# Patient Record
Sex: Male | Born: 1960 | Race: White | Hispanic: No | Marital: Married | Smoking: Current every day smoker
Health system: Southern US, Community
[De-identification: ages and names within clinical notes are randomized; demographics above are authoritative.]

---

## 2005-07-26 ENCOUNTER — Ambulatory Visit: Payer: Self-pay

## 2008-02-06 ENCOUNTER — Emergency Department: Payer: Self-pay | Admitting: Emergency Medicine

## 2010-10-23 ENCOUNTER — Emergency Department: Payer: Self-pay | Admitting: Emergency Medicine

## 2011-05-26 ENCOUNTER — Emergency Department: Payer: Self-pay | Admitting: Emergency Medicine

## 2012-04-05 LAB — URINALYSIS, COMPLETE
Bilirubin,UR: NEGATIVE
Ketone: NEGATIVE
Leukocyte Esterase: NEGATIVE
Nitrite: NEGATIVE
Ph: 5 (ref 4.5–8.0)
Protein: 100
Specific Gravity: 1.029 (ref 1.003–1.030)
Squamous Epithelial: <1
WBC UR: 1 /HPF (ref 0–5)

## 2012-04-05 LAB — CBC WITH DIFFERENTIAL/PLATELET
Basophil #: 0.2 x10 3/mm 3 — ABNORMAL HIGH (ref 0.0–0.1)
Basophil %: 1 %
Comment - H1-Com1: NORMAL
Comment - H1-Com2: NORMAL
Eosinophil #: 0.1 x10 3/mm 3 (ref 0.0–0.7)
Eosinophil %: 0.3 %
HCT: 46.9 % (ref 40.0–52.0)
HGB: 16.2 g/dL (ref 13.0–18.0)
Lymphocyte %: 15.4 %
Lymphs Abs: 3 x10 3/mm 3 (ref 1.0–3.6)
MCH: 29.5 pg (ref 26.0–34.0)
MCHC: 34.5 g/dL (ref 32.0–36.0)
MCV: 86 fL (ref 80–100)
Monocyte #: 2.4 x10 3/mm — ABNORMAL HIGH (ref 0.2–1.0)
Monocyte %: 11.9 %
Neutrophil #: 14.1 x10 3/mm 3 — ABNORMAL HIGH (ref 1.4–6.5)
Neutrophil %: 71.4 %
Platelet: 376 x10 3/mm 3 (ref 150–440)
RBC: 5.49 x10 6/mm 3 (ref 4.40–5.90)
RDW: 13.9 % (ref 11.5–14.5)
WBC: 19.7 x10 3/mm 3 — ABNORMAL HIGH (ref 3.8–10.6)

## 2012-04-05 LAB — COMPREHENSIVE METABOLIC PANEL WITH GFR
Albumin: 3.4 g/dL (ref 3.4–5.0)
Alkaline Phosphatase: 182 U/L — ABNORMAL HIGH (ref 50–136)
Anion Gap: 9 (ref 7–16)
BUN: 25 mg/dL — ABNORMAL HIGH (ref 7–18)
Bilirubin,Total: 1 mg/dL (ref 0.2–1.0)
Calcium, Total: 9.1 mg/dL (ref 8.5–10.1)
Chloride: 103 mmol/L (ref 98–107)
Co2: 26 mmol/L (ref 21–32)
Creatinine: 1 mg/dL (ref 0.60–1.30)
EGFR (African American): >60
EGFR (Non-African Amer.): >60
Glucose: 107 mg/dL — ABNORMAL HIGH (ref 65–99)
Osmolality: 281 (ref 275–301)
Potassium: 3.9 mmol/L (ref 3.5–5.1)
SGOT(AST): 38 U/L — ABNORMAL HIGH (ref 15–37)
SGPT (ALT): 76 U/L (ref 12–78)
Sodium: 138 mmol/L (ref 136–145)
Total Protein: 8.9 g/dL — ABNORMAL HIGH (ref 6.4–8.2)

## 2012-04-06 ENCOUNTER — Inpatient Hospital Stay: Payer: Self-pay | Admitting: Otolaryngology

## 2012-04-07 LAB — BETA STREP CULTURE(ARMC)

## 2012-04-11 LAB — WOUND CULTURE

## 2013-01-03 ENCOUNTER — Emergency Department: Payer: Self-pay | Admitting: Emergency Medicine

## 2013-01-03 LAB — COMPREHENSIVE METABOLIC PANEL
Albumin: 4.1 g/dL (ref 3.4–5.0)
BUN: 11 mg/dL (ref 7–18)
Bilirubin,Total: 0.5 mg/dL (ref 0.2–1.0)
Co2: 30 mmol/L (ref 21–32)
Creatinine: 1.05 mg/dL (ref 0.60–1.30)
Glucose: 113 mg/dL — ABNORMAL HIGH (ref 65–99)
Potassium: 3.9 mmol/L (ref 3.5–5.1)
Sodium: 137 mmol/L (ref 136–145)
Total Protein: 7.9 g/dL (ref 6.4–8.2)

## 2013-01-03 LAB — LIPASE, BLOOD: Lipase: 89 U/L (ref 73–393)

## 2013-01-03 LAB — CBC
MCHC: 34.9 g/dL (ref 32.0–36.0)
MCV: 86 fL (ref 80–100)
Platelet: 337 10*3/uL (ref 150–440)
RBC: 5.12 10*6/uL (ref 4.40–5.90)
RDW: 13.7 % (ref 11.5–14.5)

## 2013-01-08 IMAGING — CT CT CERVICAL SPINE WITHOUT CONTRAST
1 series · 12 of 14 positions shown, 15 images · non-contrast
Comparison: none

REASON FOR EXAM: posterior neck pain, fell 2 weeks ago
COMMENTS:

[Series 4: axial · axial · 0.33mm/px · z∈[-245,-95]mm · 12 of 94 slices shown, 15 images]
[im 8/94  soft-tissue]
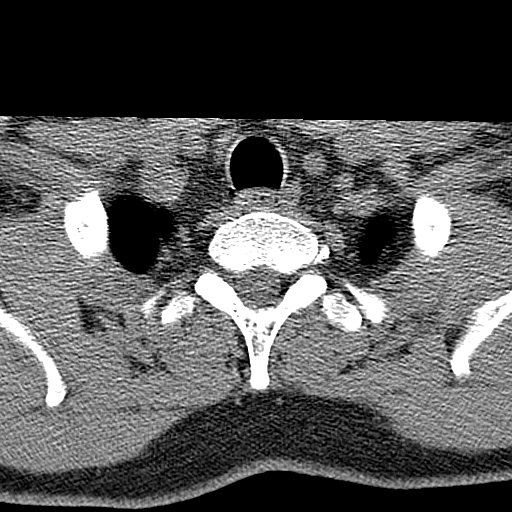
[im 8/94  bone]
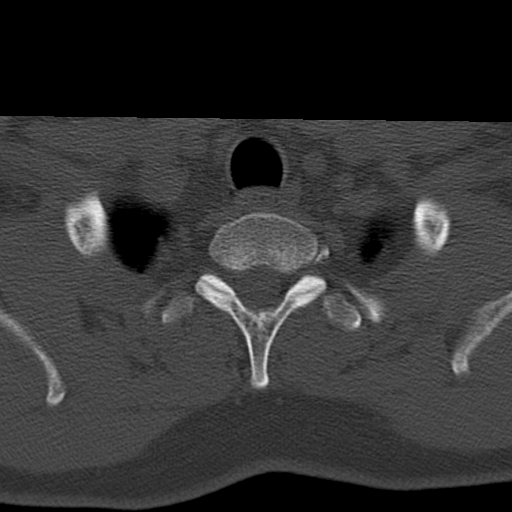
[im 15/94  bone]
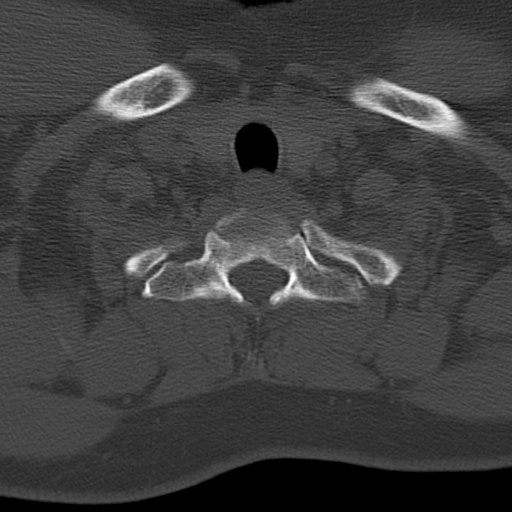
[im 22/94  bone]
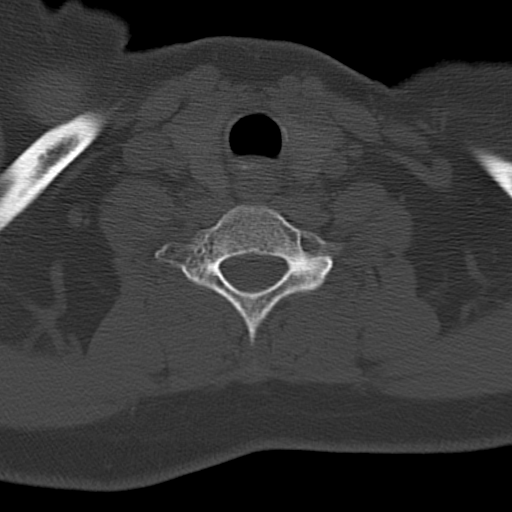
[im 29/94  bone]
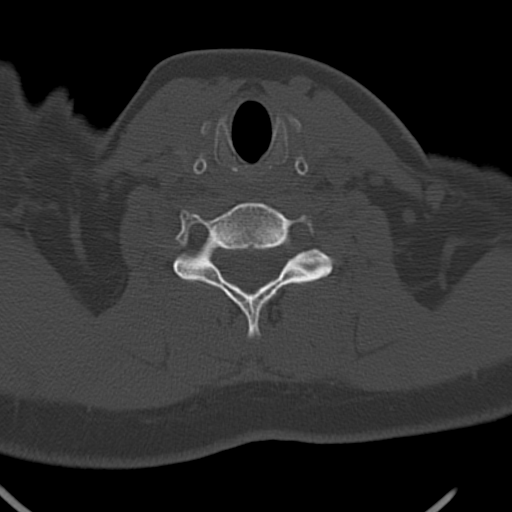
[im 36/94  soft-tissue]
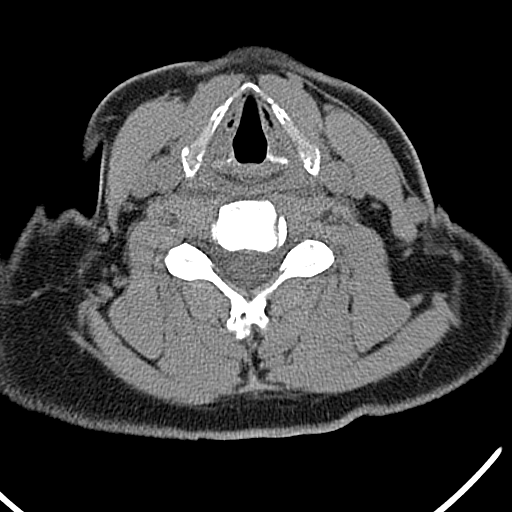
[im 36/94  bone]
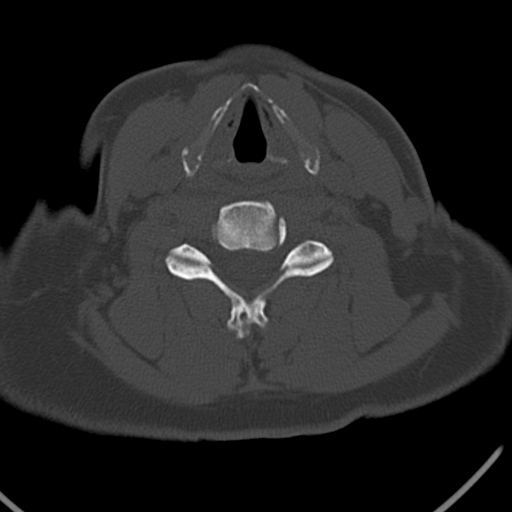
[im 43/94  bone]
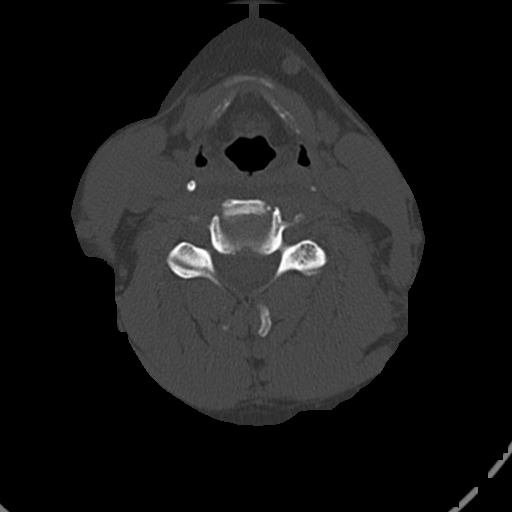
[im 51/94  bone]
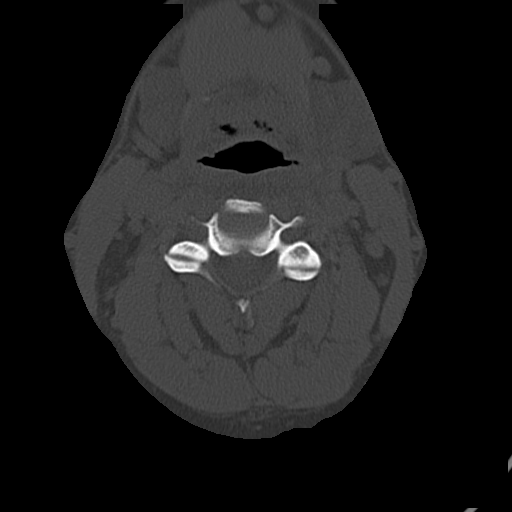
[im 58/94  bone]
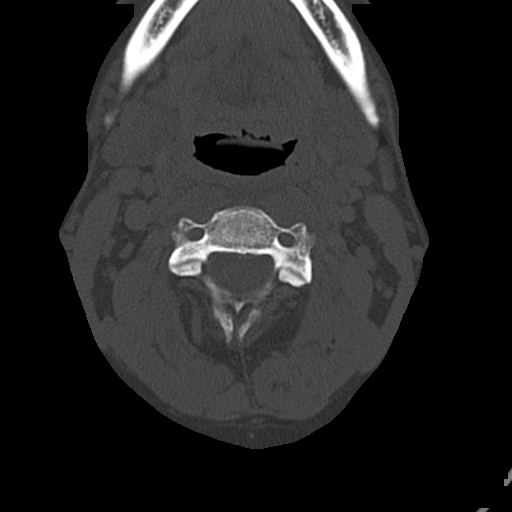
[im 65/94  soft-tissue]
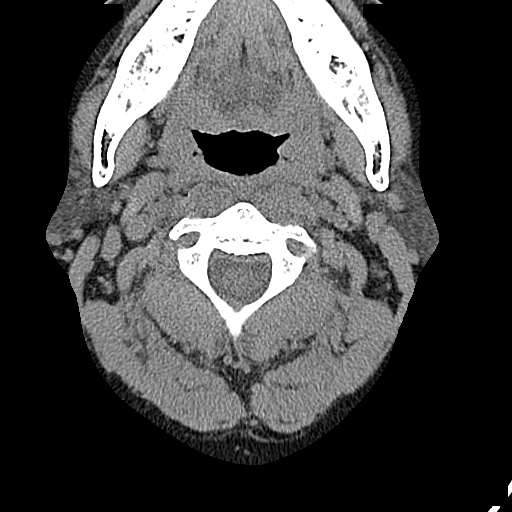
[im 65/94  bone]
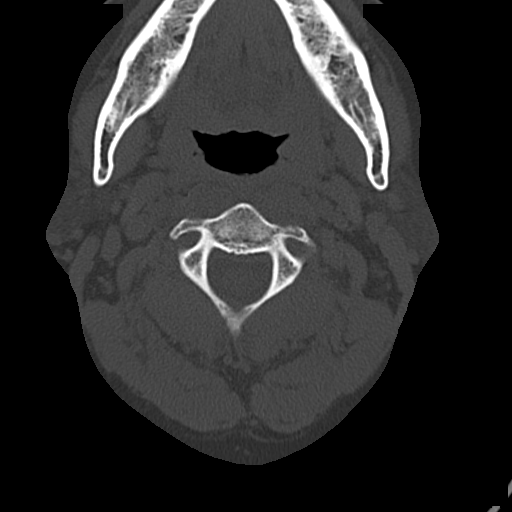
[im 72/94  bone]
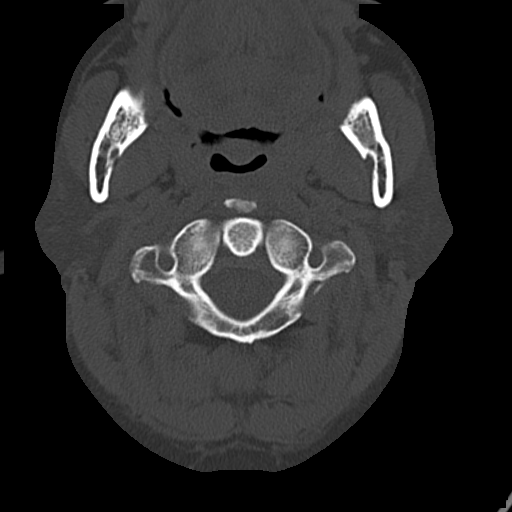
[im 79/94  bone]
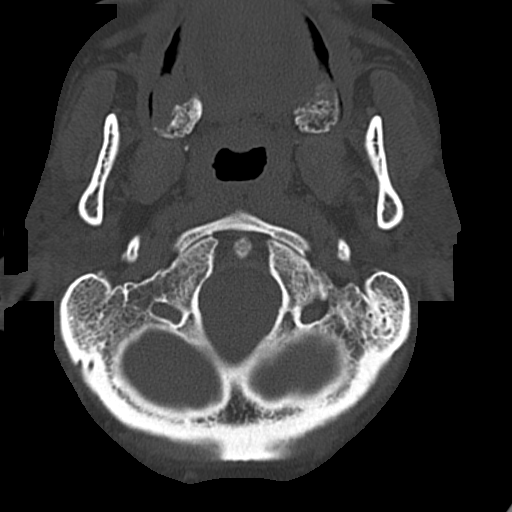
[im 86/94  bone]
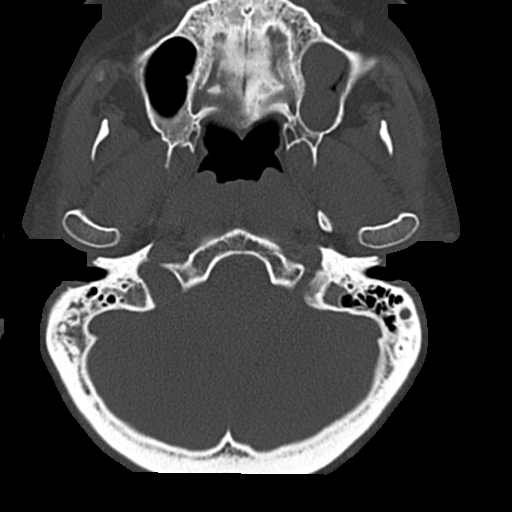

[12 of 14 positions shown; findings below may reference images not displayed]

PROCEDURE:     CT  - CT CERVICAL SPINE WO  - October 23, 2010  [DATE]

RESULT:     Multislice helical acquisition is performed through the cervical
spine with bone window reconstructions performed in the axial, coronal and
sagittal planes. The patient has no previous exam for comparison.

Spinal alignment appears to be maintained. The prevertebral soft tissues
appear normal. The spinous processes appear intact. There is no evidence of
bony encroachment into the spinal canal. The craniocervical junction and
atlantoaxial alignment appear to be normal.
IMPRESSION: No CT evidence of an acute cervical spine bony abnormality.

## 2014-06-22 IMAGING — CR DG CHEST 2V
1 series · 2 of 2 positions shown · non-contrast
Comparison: none

REASON FOR EXAM: cough with dark expectorate
COMMENTS:   May transport without cardiac monitor

[Series 1: w chest pa · 0.14mm/px · 2 of 2 slices shown]
[im 1/2]
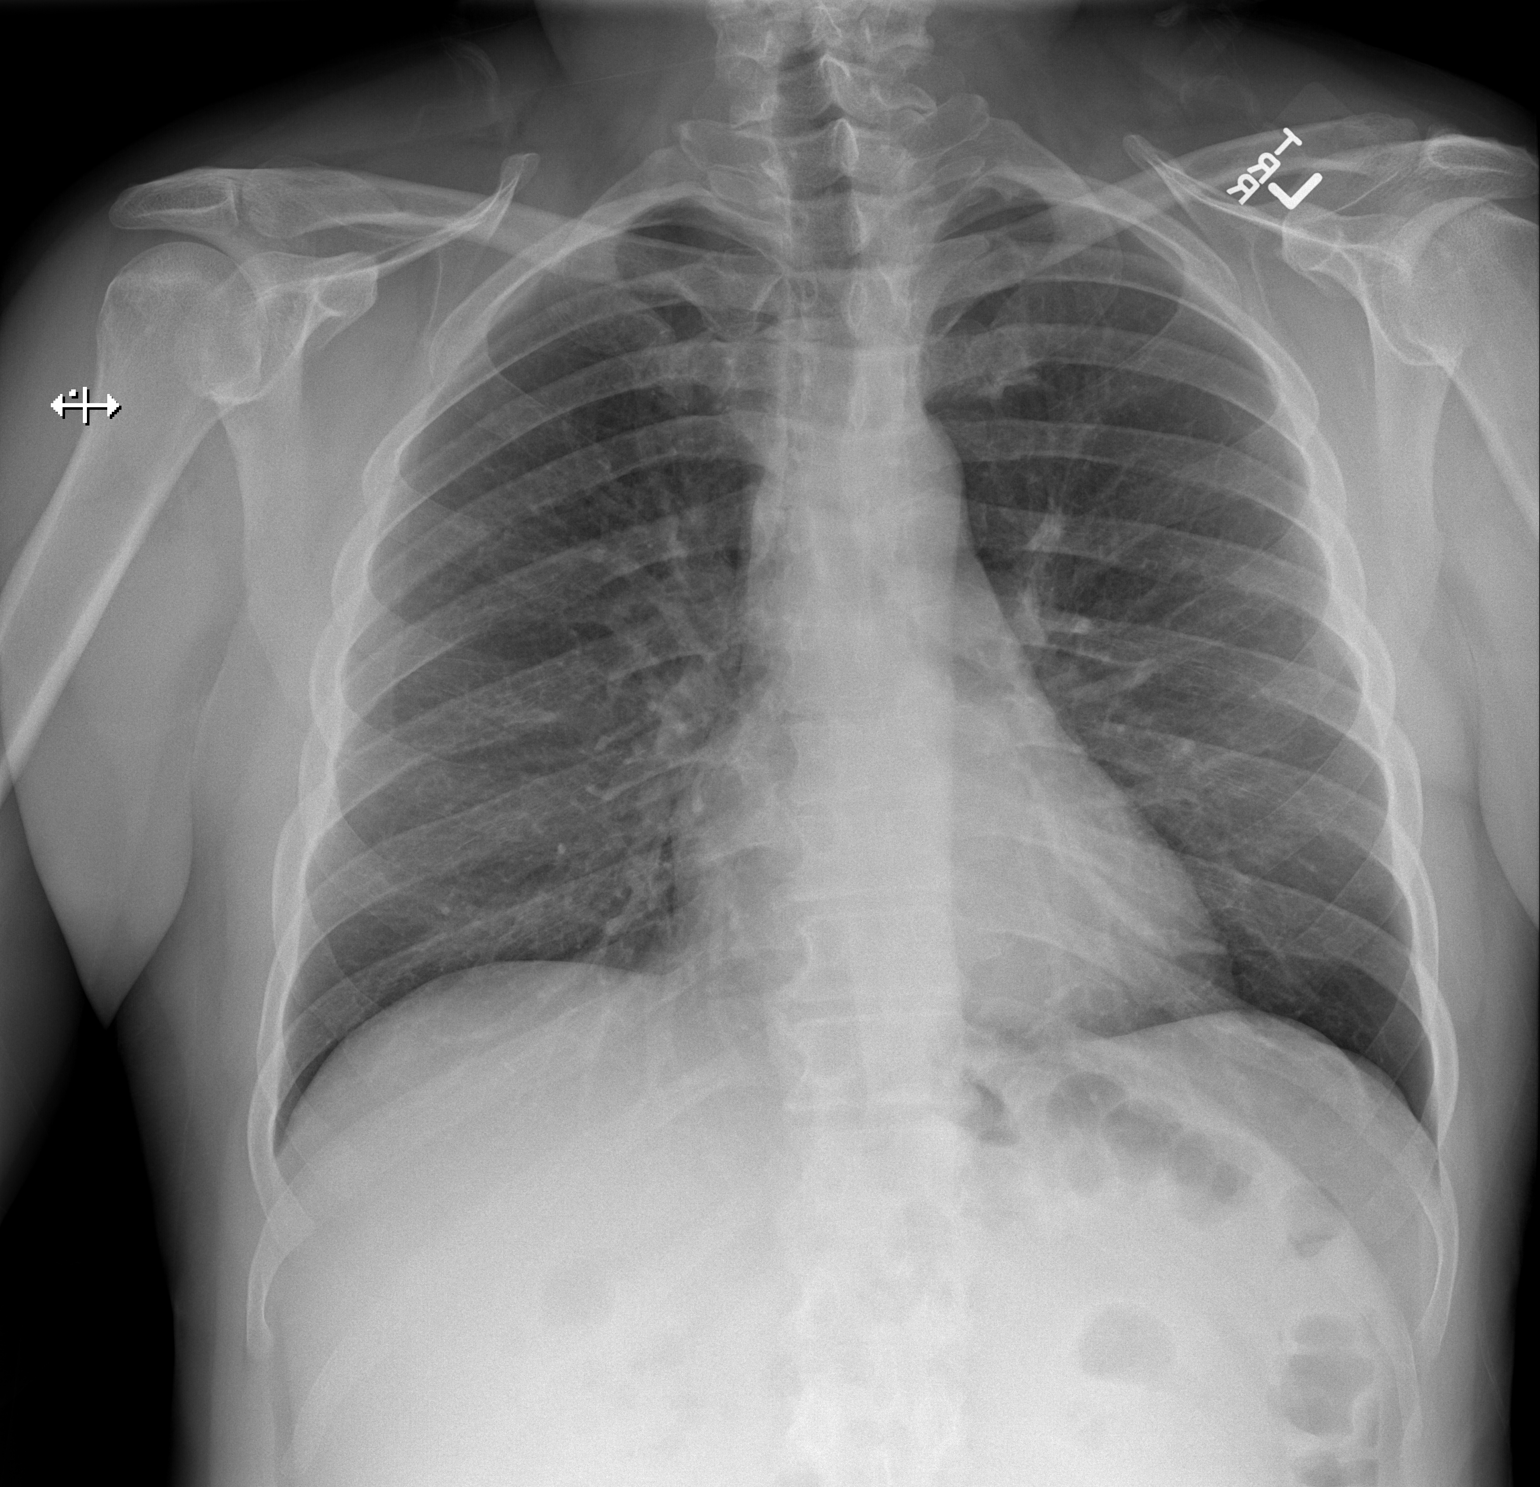
[im 2/2]
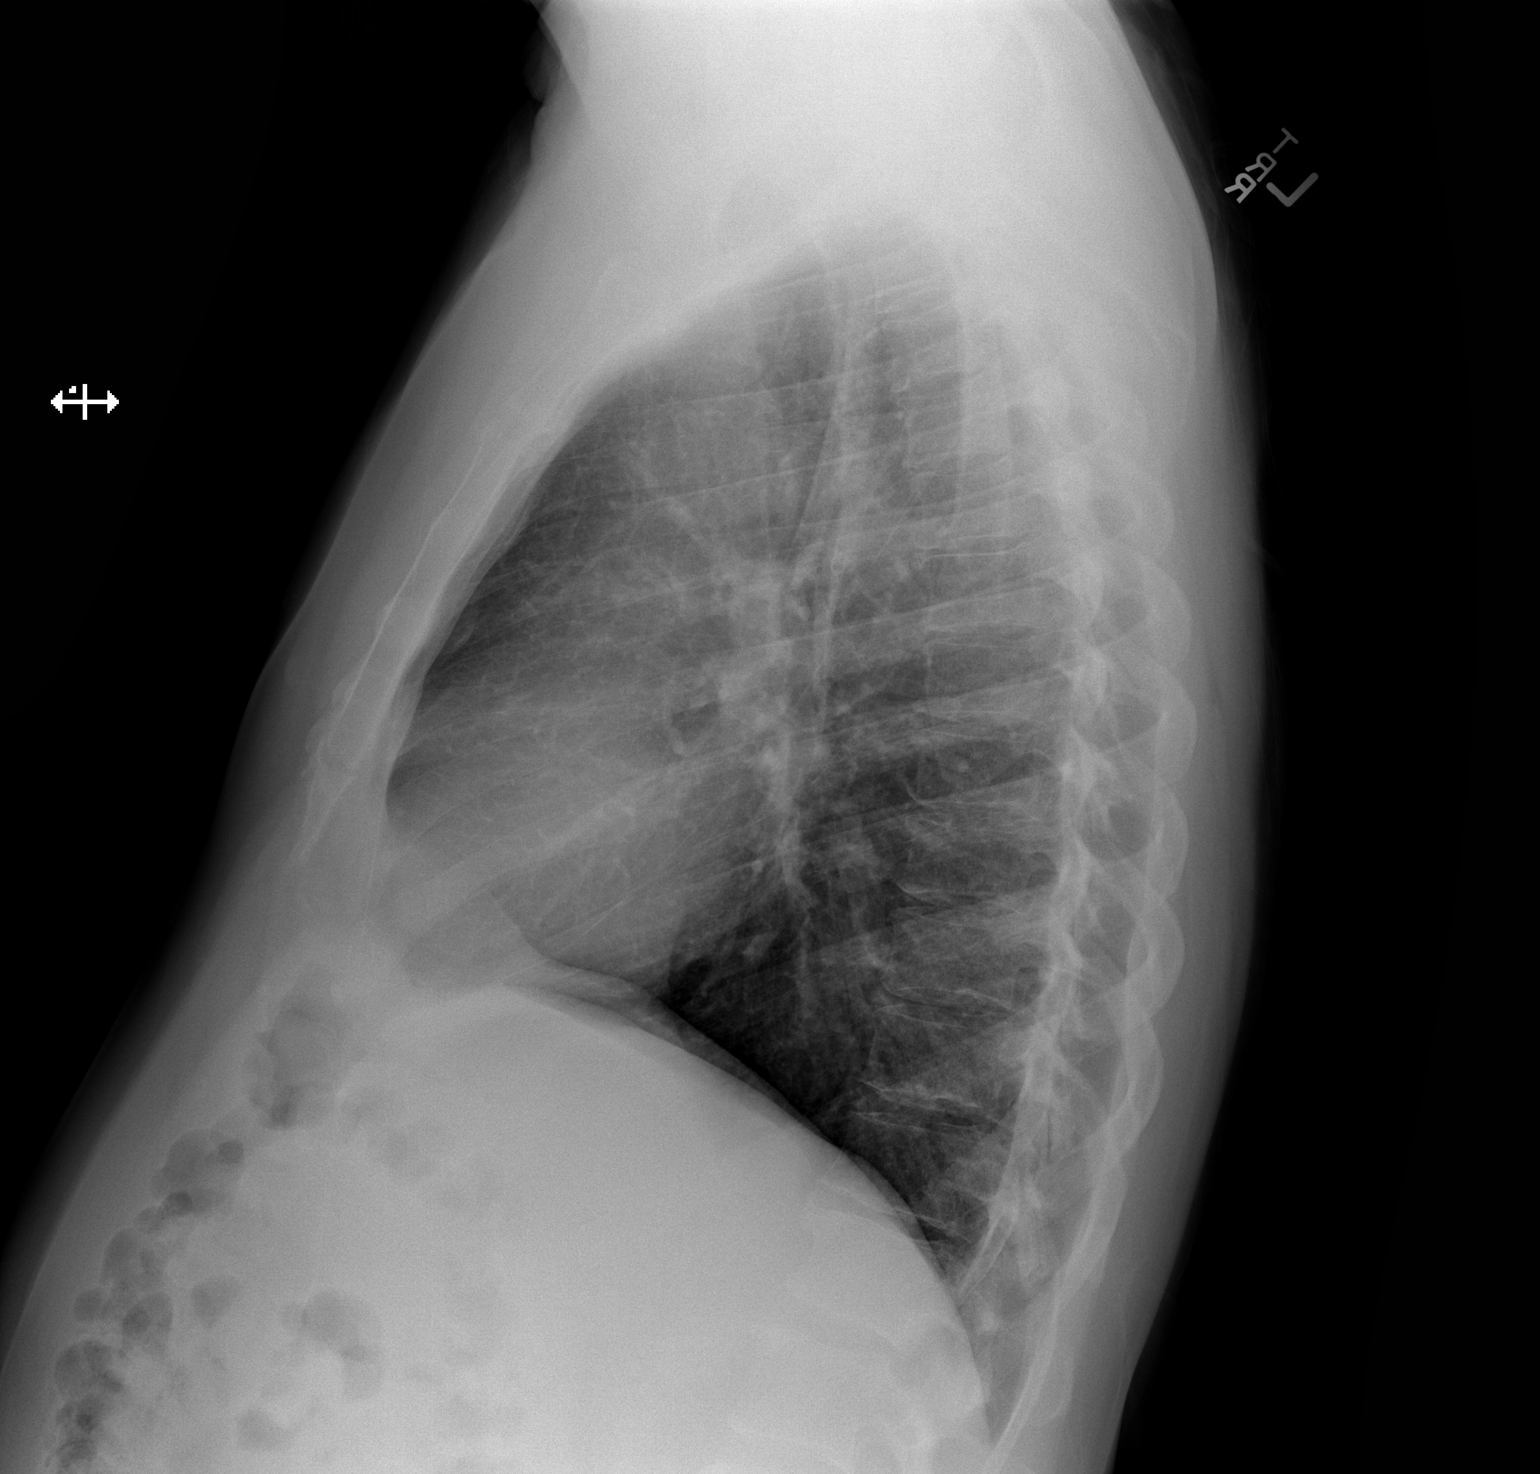

[2 of 2 positions shown; findings below may reference images not displayed]

PROCEDURE:     DXR - DXR CHEST PA (OR AP) AND LATERAL  - April 05, 2012  [DATE]

RESULT:     Comparison is made to the study 26 May, 2011.

The lungs are clear. The heart and pulmonary vessels are normal. The bony
and mediastinal structures are unremarkable. There is no effusion. There is
no pneumothorax or evidence of congestive failure.
IMPRESSION: No acute cardiopulmonary disease.

[REDACTED]

## 2014-07-26 NOTE — Op Note (Signed)
PATIENT NAME:  Kevin Blevins, Kevin Blevins MR#:  [id number] DATE OF BIRTH:  1960/05/25  DATE OF PROCEDURE:  04/06/2012  PREOPERATIVE DIAGNOSIS: Left peritonsillar abscess.   POSTOPERATIVE DIAGNOSIS: Left peritonsillar abscess.  PROCEDURE: Incision and drainage left peritonsillar abscess.   SURGEON: Gertie BaronMadison Mathew Postiglione, MD   DESCRIPTION OF PROCEDURE: The patient was placed in the supine position on the operating room table. After general endotracheal anesthesia had been induced, the patient was turned approximately 40 degrees clockwise from Anesthesia, placed in a head-extended position. The Dingman mouth retractor was placed. The left tonsil was noted to be oozing pus through the superior pole. Local anesthesia was injected lateral to the tonsil itself. Palpation revealed some firm tissue there. A #15 blade was used to make a 3 cm incision, and this was opened widely. The pus was encountered and sampled with a micro swab. The tonsil and peritonsillar incision were irrigated copiously with saline, and the patient was then returned to Anesthesia, allowed to emerge from anesthesia in the operating room, and taken to the recovery room in stable condition. There were no complications. Estimated blood loss: 25 mL.   ____________________________ J. Gertie BaronMadison Toia Micale, MD [email or web address] D: 04/06/2012 10:32:53 ET T: 04/06/2012 10:47:18 ET JOB#: [id number]  cc: Zackery BarefootJ. Madison Ivianna Notch, MD, <Dictator> Wendee CoppJMADISON Naz Denunzio MD ELECTRONICALLY SIGNED 04/10/2012 8:24

## 2015-09-14 ENCOUNTER — Encounter: Payer: Self-pay | Admitting: Emergency Medicine

## 2015-09-14 DIAGNOSIS — J209 Acute bronchitis, unspecified: Secondary | ICD-10-CM | POA: Insufficient documentation

## 2015-09-14 DIAGNOSIS — F17210 Nicotine dependence, cigarettes, uncomplicated: Secondary | ICD-10-CM | POA: Insufficient documentation

## 2015-09-14 LAB — CBC
HEMATOCRIT: 46.5 % (ref 40.0–52.0)
Hemoglobin: 15.7 g/dL (ref 13.0–18.0)
MCH: 28.9 pg (ref 26.0–34.0)
MCHC: 33.8 g/dL (ref 32.0–36.0)
MCV: 85.5 fL (ref 80.0–100.0)
Platelets: 339 10*3/uL (ref 150–440)
RBC: 5.44 MIL/uL (ref 4.40–5.90)
RDW: 14.2 % (ref 11.5–14.5)
WBC: 10.7 10*3/uL — AB (ref 3.8–10.6)

## 2015-09-14 NOTE — ED Notes (Signed)
Patient with complaint of shortness of breath, cough and congestion times 3 days.

## 2015-09-15 ENCOUNTER — Emergency Department: Payer: Self-pay

## 2015-09-15 ENCOUNTER — Emergency Department
Admission: EM | Admit: 2015-09-15 | Discharge: 2015-09-15 | Disposition: A | Discharge status: Home / Self Care | Payer: Self-pay | Attending: Emergency Medicine | Admitting: Emergency Medicine

## 2015-09-15 DIAGNOSIS — J40 Bronchitis, not specified as acute or chronic: Secondary | ICD-10-CM

## 2015-09-15 LAB — BASIC METABOLIC PANEL
ANION GAP: 8 (ref 5–15)
BUN: 19 mg/dL (ref 6–20)
CHLORIDE: 104 mmol/L (ref 101–111)
CO2: 24 mmol/L (ref 22–32)
Calcium: 8.9 mg/dL (ref 8.9–10.3)
Creatinine, Ser: 1.22 mg/dL (ref 0.61–1.24)
GFR calc non Af Amer: >60 mL/min (ref >60)
Glucose, Bld: 141 mg/dL — ABNORMAL HIGH (ref 65–99)
POTASSIUM: 3.6 mmol/L (ref 3.5–5.1)
SODIUM: 136 mmol/L (ref 135–145)

## 2015-09-15 LAB — TROPONIN I: Troponin I: <0.03 ng/mL (ref <0.031)

## 2015-09-15 MED ORDER — IPRATROPIUM-ALBUTEROL 0.5-2.5 (3) MG/3ML IN SOLN
3.0000 mL | Freq: Once | RESPIRATORY_TRACT | Status: AC
  Administered 2015-09-15: 3 mL via RESPIRATORY_TRACT
  Filled 2015-09-15: qty 3

## 2015-09-15 MED ORDER — ALBUTEROL SULFATE (2.5 MG/3ML) 0.083% IN NEBU: 2.5000 mg | INHALATION_SOLUTION | Freq: Four times a day (QID) | RESPIRATORY_TRACT | Status: AC | PRN

## 2015-09-15 MED ORDER — IPRATROPIUM-ALBUTEROL 0.5-2.5 (3) MG/3ML IN SOLN: 3.0000 mL | Freq: Once | RESPIRATORY_TRACT | Status: DC

## 2015-09-15 MED ORDER — BENZONATATE 100 MG PO CAPS: 100.0000 mg | ORAL_CAPSULE | Freq: Four times a day (QID) | ORAL | Status: AC | PRN

## 2015-09-15 NOTE — Discharge Instructions (Signed)
Please seek medical attention for any high fevers, chest pain, shortness of breath, change in behavior, persistent vomiting, bloody stool or any other new or concerning symptoms. ° ° °Upper Respiratory Infection, Adult °Most upper respiratory infections (URIs) are caused by a virus. A URI affects the nose, throat, and upper air passages. The most common type of URI is often called "the common cold." °HOME CARE  °· Take medicines only as told by your doctor. °· Gargle warm saltwater or take cough drops to comfort your throat as told by your doctor. °· Use a warm mist humidifier or inhale steam from a shower to increase air moisture. This may make it easier to breathe. °· Drink enough fluid to keep your pee (urine) clear or pale yellow. °· Eat soups and other clear broths. °· Have a healthy diet. °· Rest as needed. °· Go back to work when your fever is gone or your doctor says it is okay. °¨ You may need to stay home longer to avoid giving your URI to others. °¨ You can also wear a face mask and wash your hands often to prevent spread of the virus. °· Use your inhaler more if you have asthma. °· Do not use any tobacco products, including cigarettes, chewing tobacco, or electronic cigarettes. If you need help quitting, ask your doctor. °GET HELP IF: °· You are getting worse, not better. °· Your symptoms are not helped by medicine. °· You have chills. °· You are getting more short of breath. °· You have brown or red mucus. °· You have yellow or brown discharge from your nose. °· You have pain in your face, especially when you bend forward. °· You have a fever. °· You have puffy (swollen) neck glands. °· You have pain while swallowing. °· You have white areas in the back of your throat. °GET HELP RIGHT AWAY IF:  °· You have very bad or constant: °¨ Headache. °¨ Ear pain. °¨ Pain in your forehead, behind your eyes, and over your cheekbones (sinus pain). °¨ Chest pain. °· You have long-lasting (chronic) lung disease and  any of the following: °¨ Wheezing. °¨ Long-lasting cough. °¨ Coughing up blood. °¨ A change in your usual mucus. °· You have a stiff neck. °· You have changes in your: °¨ Vision. °¨ Hearing. °¨ Thinking. °¨ Mood. °MAKE SURE YOU:  °· Understand these instructions. °· Will watch your condition. °· Will get help right away if you are not doing well or get worse. °  °This information is not intended to replace advice given to you by your health care provider. Make sure you discuss any questions you have with your health care provider. °  °Document Released: 09/08/2007 Document Revised: 08/06/2014 Document Reviewed: 06/27/2013 °Elsevier Interactive Patient Education ©2016 [employer] ° °

## 2015-09-15 NOTE — ED Provider Notes (Signed)
[facility] Emergency Department Provider Note    ____________________________________________  Time seen: ~0125  I have reviewed the triage vital signs and the nursing notes.   HISTORY  Chief Complaint Shortness of Breath; Cough; and Nasal Congestion   History limited by: Not Limited   HPI Kevin Blevins is a 55 y.o. male who presents to the emergency department today because of concerns for cough congestion and difficulty breathing. The symptoms started 3 days ago. They've gradually been getting worse. It has been productive of thick yellow sputum. No significant chest pain associated with this. No fevers. He does smoke half pack a day. Family states that he had an episode of asthma a couple of years ago.    History reviewed. No pertinent past medical history.  There are no active problems to display for this patient.   History reviewed. No pertinent past surgical history.  No current outpatient prescriptions on file.  Allergies Review of patient's allergies indicates no known allergies.  No family history on file.  Social History Social History  Substance Use Topics  . Smoking status: Current Every Day Smoker -- 0.50 packs/day    Types: Cigarettes  . Smokeless tobacco: None  . Alcohol Use: None    Review of Systems  Constitutional: Negative for fever. Cardiovascular: Negative for chest pain. Respiratory: Positive for shortness of breath and cough Gastrointestinal: Negative for abdominal pain, vomiting and diarrhea. Neurological: Negative for headaches, focal weakness or numbness.  10-point ROS otherwise negative.  ____________________________________________   PHYSICAL EXAM:  VITAL SIGNS: ED Triage Vitals  Enc Vitals Group     BP 09/14/15 2327 140/73 mmHg     Pulse Rate 09/14/15 2327 74     Resp 09/14/15 2327 22     Temp 09/14/15 2327 98.1 F (36.7 C)     Temp Source 09/14/15 2327 Oral     SpO2 09/14/15 2327 96 %      Weight 09/14/15 2331 172 lb (78.019 kg)     Height 09/14/15 2331  (1.702 m)   Constitutional: Alert and oriented. Well appearing and in no distress. Eyes: Conjunctivae are normal. PERRL. Normal extraocular movements. ENT   Head: Normocephalic and atraumatic.   Nose: No congestion/rhinnorhea.   Mouth/Throat: Mucous membranes are moist.   Neck: No stridor. Hematological/Lymphatic/Immunilogical: No cervical lymphadenopathy. Cardiovascular: Normal rate, regular rhythm.  No murmurs, rubs, or gallops. Respiratory: Normal respiratory effort without tachypnea nor retractions. Diffuse wheezing Gastrointestinal: Soft and nontender. No distention. There is no CVA tenderness. Genitourinary: Deferred Musculoskeletal: Normal range of motion in all extremities. No joint effusions.  No lower extremity tenderness nor edema. Neurologic:  Normal speech and language. No gross focal neurologic deficits are appreciated.  Skin:  Skin is warm, dry and intact. No rash noted. Psychiatric: Mood and affect are normal. Speech and behavior are normal. Patient exhibits appropriate insight and judgment.  ____________________________________________    LABS (pertinent positives/negatives)  Labs Reviewed  BASIC METABOLIC PANEL - Abnormal; Notable for the following:    Glucose, Bld 141 (*)    All other components within normal limits  CBC - Abnormal; Notable for the following:    WBC 10.7 (*)    All other components within normal limits  TROPONIN I     ____________________________________________   EKG  I, Phineas Semen, attending physician, personally viewed and interpreted this EKG  EKG Time: 2327 Rate: 77 Rhythm: normal sinus rhythm Axis: left axis deviation Intervals: qtc 416 QRS: narrow, q waves V2 ST changes:  no st elevation Impression: abnormal ekg  ____________________________________________  RADIOLOGY  CXR IMPRESSION: Emphysematous changes and chronic  bronchitic changes in the lungs. No evidence of active pulmonary disease. ____________________________________________   PROCEDURES  Procedure(s) performed: None  Critical Care performed: No  ____________________________________________   INITIAL IMPRESSION / ASSESSMENT AND PLAN / ED COURSE  Pertinent labs & imaging results that were available during my care of the patient were reviewed by me and considered in my medical decision making (see chart for details).  Patient presented to the emergency department today with concerns for cough, congestion and shortness of breath. On exam patient had diffuse wheezing. Chest x-ray does show sinus changes. No pneumonia. Patient stated he did feel better after DuoNeb. Family states they do have nebulizer machine at home. Will prescription for nebulizer solution and Tessalon Perles.  ____________________________________________   FINAL CLINICAL IMPRESSION(S) / ED DIAGNOSES  Final diagnoses:  Bronchitis     Note: This dictation was prepared with Dragon dictation. Any transcriptional errors that result from this process are unintentional    Phineas SemenGraydon Marianita Botkin, MD 09/15/15 0221

## 2017-12-01 IMAGING — CR DG CHEST 2V
1 series · 2 of 2 positions shown · non-contrast
Comparison: 01/03/2013

CLINICAL DATA: Cough and shortness of breath for 2 days.  Smoker.

EXAM:
CHEST  2 VIEW

[Series 1: view not recorded · 0.14mm/px · 2 of 2 slices shown]
[im 1/2]
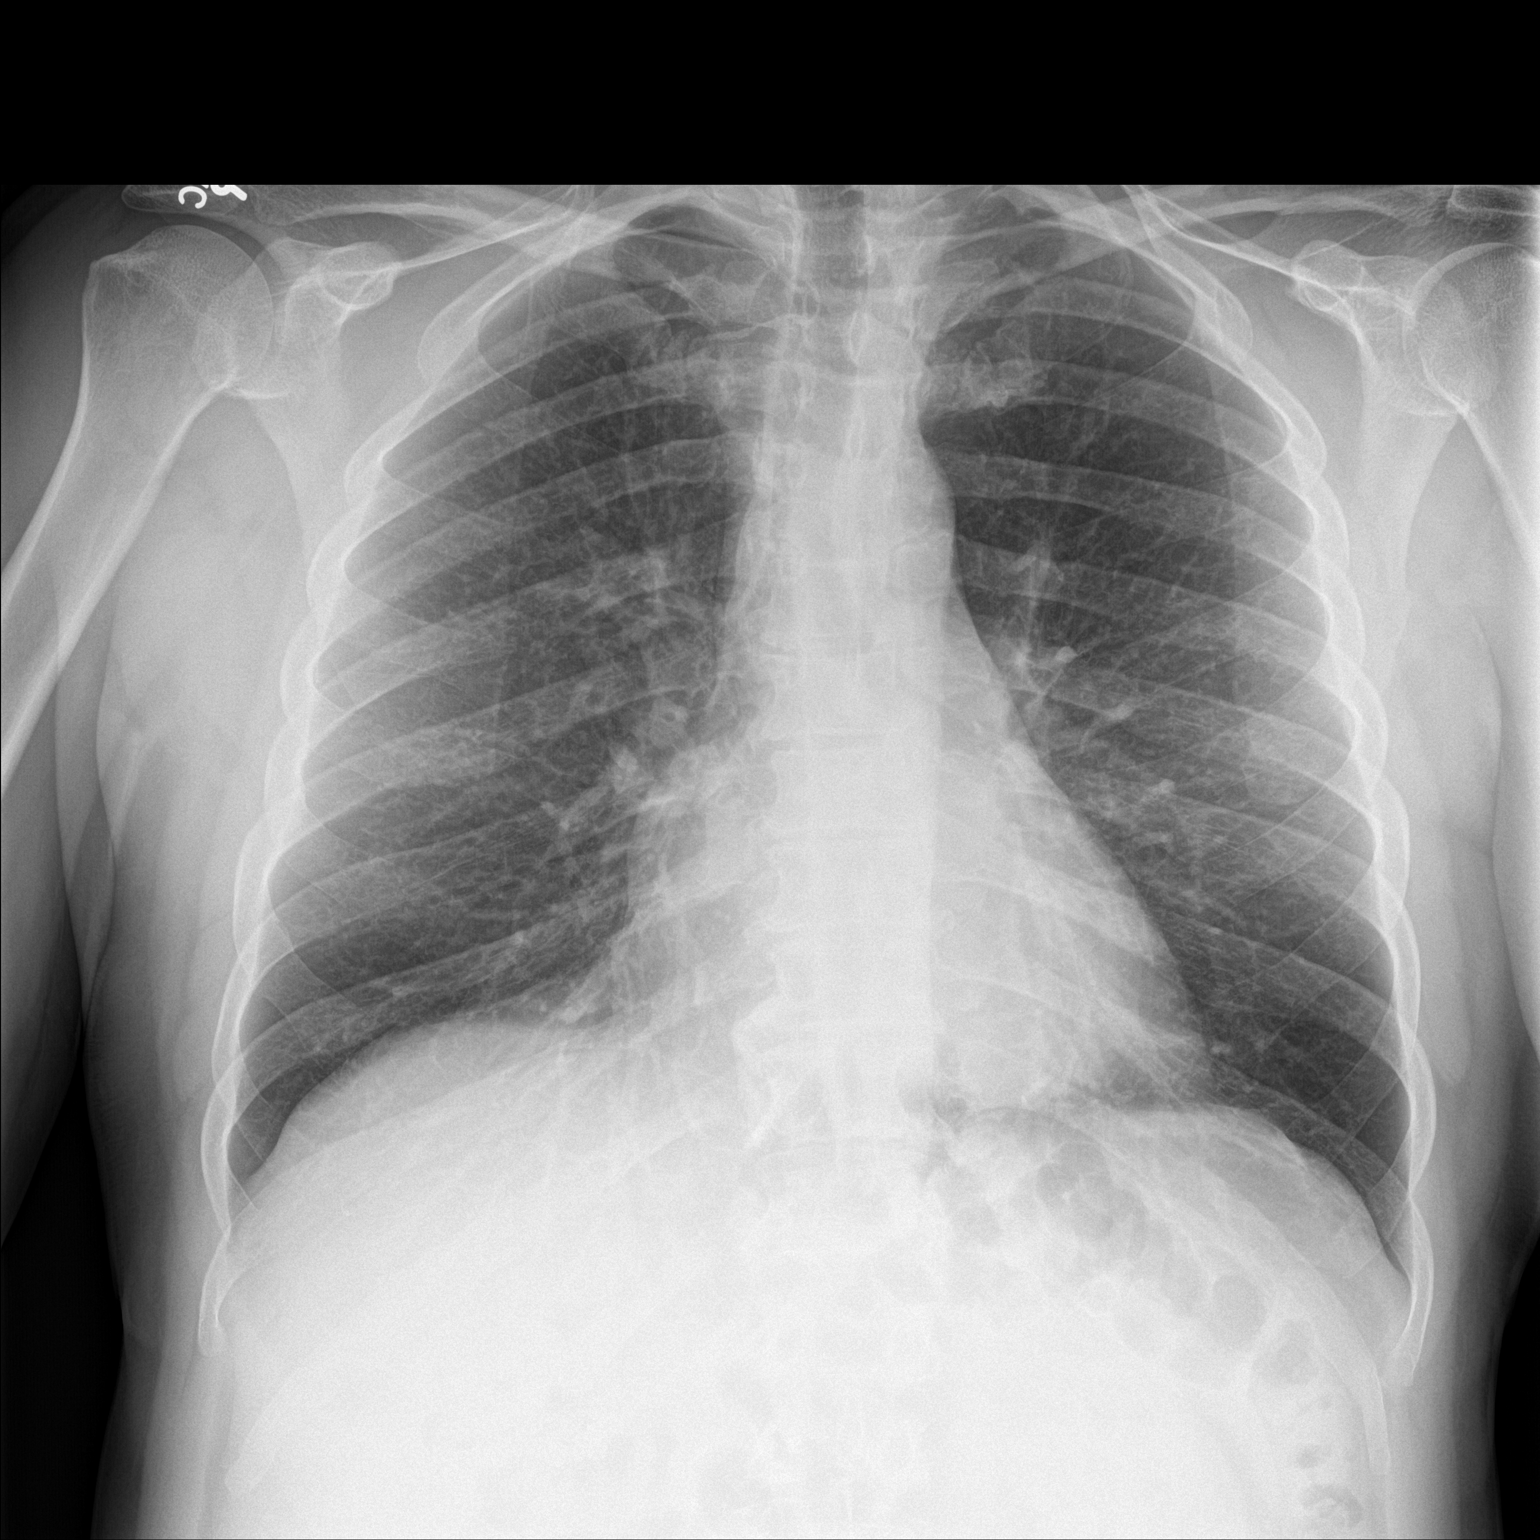
[im 2/2]
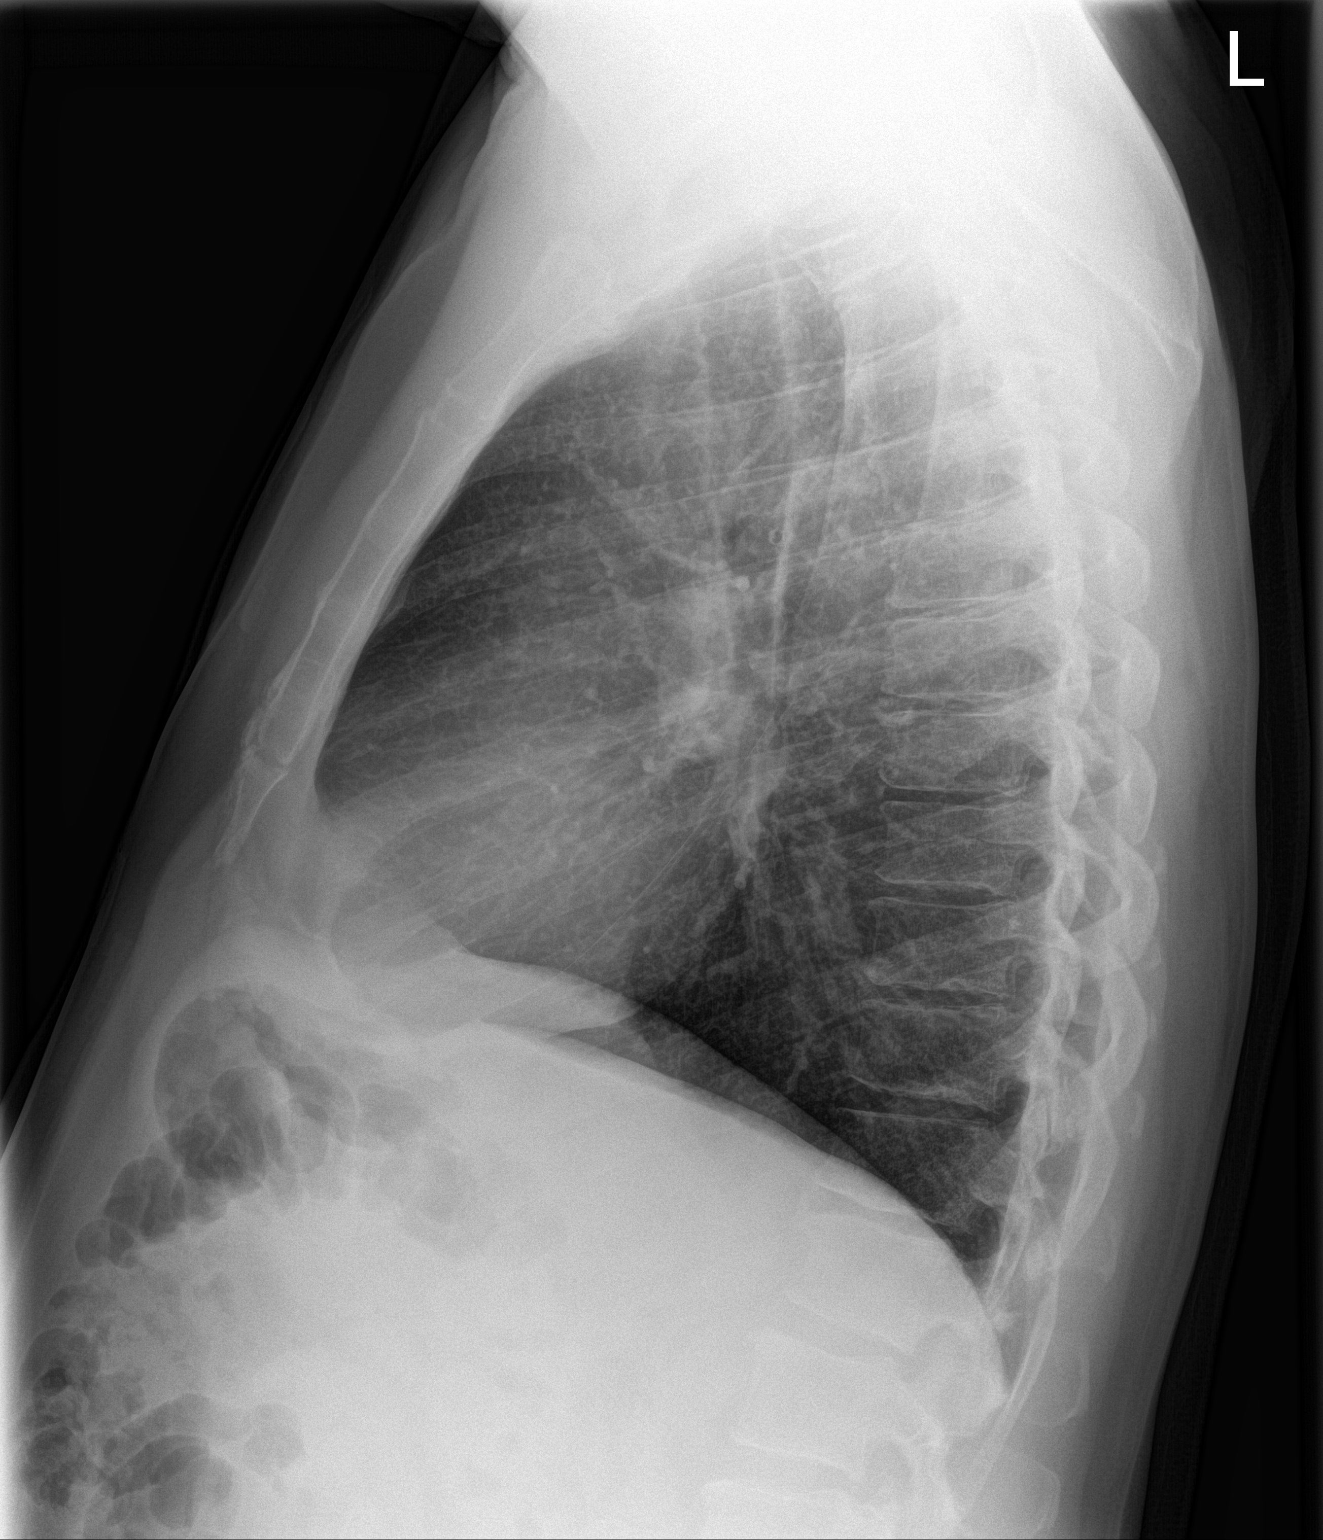

[2 of 2 positions shown; findings below may reference images not displayed]

FINDINGS: Mild hyperinflation may indicate emphysema. Peribronchial thickening
and central interstitial changes likely due to chronic bronchitis
from smoking. Normal heart size and pulmonary vascularity. No focal
airspace disease or consolidation in the lungs. No blunting of
costophrenic angles. No pneumothorax. Mediastinal contours appear
intact.
IMPRESSION: Emphysematous changes and chronic bronchitic changes in the lungs.
No evidence of active pulmonary disease.

## 2019-04-24 ENCOUNTER — Ambulatory Visit: Payer: Self-pay | Attending: Internal Medicine

## 2019-04-24 DIAGNOSIS — Z20822 Contact with and (suspected) exposure to covid-19: Secondary | ICD-10-CM | POA: Insufficient documentation

## 2019-04-25 LAB — NOVEL CORONAVIRUS, NAA: SARS-CoV-2, NAA: NOT DETECTED
